# Patient Record
Sex: Female | Born: 2005 | Race: Black or African American | Hispanic: No | Marital: Single | Smoking: Never smoker
Health system: Southern US, Community
[De-identification: ages and names within clinical notes are randomized; demographics above are authoritative.]

## PROBLEM LIST (undated history)

## (undated) DIAGNOSIS — F909 Attention-deficit hyperactivity disorder, unspecified type: Principal | ICD-10-CM

## (undated) HISTORY — DX: Attention-deficit hyperactivity disorder, unspecified type: F90.9

---
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
## 2024-01-28 ENCOUNTER — Ambulatory Visit: Admitting: Pediatrics
